# Patient Record
Sex: Male | Born: 1965 | Race: Black or African American | Hispanic: No | Smoking: Current every day smoker
Health system: Southern US, Community
[De-identification: ages and names within clinical notes are randomized; demographics above are authoritative.]

## PROBLEM LIST (undated history)

## (undated) DIAGNOSIS — Z720 Tobacco use: Secondary | ICD-10-CM

## (undated) DIAGNOSIS — E119 Type 2 diabetes mellitus without complications: Secondary | ICD-10-CM

## (undated) DIAGNOSIS — F1010 Alcohol abuse, uncomplicated: Secondary | ICD-10-CM

## (undated) HISTORY — PX: TONSILLECTOMY: SUR1361

---

## 2006-07-06 ENCOUNTER — Ambulatory Visit: Payer: Self-pay | Admitting: Family Medicine

## 2006-07-06 ENCOUNTER — Inpatient Hospital Stay (HOSPITAL_COMMUNITY): Admission: EM | Admit: 2006-07-06 | Discharge: 2006-07-08 | Payer: Self-pay | Admitting: Emergency Medicine

## 2012-04-21 ENCOUNTER — Other Ambulatory Visit: Payer: Self-pay | Admitting: Family Medicine

## 2016-10-31 DIAGNOSIS — E1140 Type 2 diabetes mellitus with diabetic neuropathy, unspecified: Secondary | ICD-10-CM | POA: Diagnosis not present

## 2016-10-31 DIAGNOSIS — I10 Essential (primary) hypertension: Secondary | ICD-10-CM | POA: Diagnosis not present

## 2016-10-31 DIAGNOSIS — E7800 Pure hypercholesterolemia, unspecified: Secondary | ICD-10-CM | POA: Diagnosis not present

## 2016-10-31 DIAGNOSIS — E119 Type 2 diabetes mellitus without complications: Secondary | ICD-10-CM | POA: Diagnosis not present

## 2016-11-17 DIAGNOSIS — Z1389 Encounter for screening for other disorder: Secondary | ICD-10-CM | POA: Diagnosis not present

## 2016-11-17 DIAGNOSIS — E7800 Pure hypercholesterolemia, unspecified: Secondary | ICD-10-CM | POA: Diagnosis not present

## 2016-11-17 DIAGNOSIS — I10 Essential (primary) hypertension: Secondary | ICD-10-CM | POA: Diagnosis not present

## 2016-11-17 DIAGNOSIS — E119 Type 2 diabetes mellitus without complications: Secondary | ICD-10-CM | POA: Diagnosis not present

## 2016-11-17 DIAGNOSIS — E1140 Type 2 diabetes mellitus with diabetic neuropathy, unspecified: Secondary | ICD-10-CM | POA: Diagnosis not present

## 2017-01-19 DIAGNOSIS — E663 Overweight: Secondary | ICD-10-CM | POA: Diagnosis not present

## 2017-01-19 DIAGNOSIS — I10 Essential (primary) hypertension: Secondary | ICD-10-CM | POA: Diagnosis not present

## 2017-01-19 DIAGNOSIS — E119 Type 2 diabetes mellitus without complications: Secondary | ICD-10-CM | POA: Diagnosis not present

## 2017-01-19 DIAGNOSIS — E7800 Pure hypercholesterolemia, unspecified: Secondary | ICD-10-CM | POA: Diagnosis not present

## 2017-01-19 DIAGNOSIS — E1140 Type 2 diabetes mellitus with diabetic neuropathy, unspecified: Secondary | ICD-10-CM | POA: Diagnosis not present

## 2017-03-02 DIAGNOSIS — Z7982 Long term (current) use of aspirin: Secondary | ICD-10-CM | POA: Diagnosis not present

## 2017-03-02 DIAGNOSIS — Z79899 Other long term (current) drug therapy: Secondary | ICD-10-CM | POA: Diagnosis not present

## 2017-03-02 DIAGNOSIS — F17210 Nicotine dependence, cigarettes, uncomplicated: Secondary | ICD-10-CM | POA: Diagnosis not present

## 2017-03-02 DIAGNOSIS — T63441A Toxic effect of venom of bees, accidental (unintentional), initial encounter: Secondary | ICD-10-CM | POA: Diagnosis not present

## 2017-03-02 DIAGNOSIS — R05 Cough: Secondary | ICD-10-CM | POA: Diagnosis not present

## 2017-03-02 DIAGNOSIS — L299 Pruritus, unspecified: Secondary | ICD-10-CM | POA: Diagnosis not present

## 2017-03-02 DIAGNOSIS — E119 Type 2 diabetes mellitus without complications: Secondary | ICD-10-CM | POA: Diagnosis not present

## 2017-03-02 DIAGNOSIS — R221 Localized swelling, mass and lump, neck: Secondary | ICD-10-CM | POA: Diagnosis not present

## 2017-03-02 DIAGNOSIS — Z7984 Long term (current) use of oral hypoglycemic drugs: Secondary | ICD-10-CM | POA: Diagnosis not present

## 2017-03-02 DIAGNOSIS — I10 Essential (primary) hypertension: Secondary | ICD-10-CM | POA: Diagnosis not present

## 2017-03-02 DIAGNOSIS — E7800 Pure hypercholesterolemia, unspecified: Secondary | ICD-10-CM | POA: Diagnosis not present

## 2017-04-20 DIAGNOSIS — I10 Essential (primary) hypertension: Secondary | ICD-10-CM | POA: Diagnosis not present

## 2017-04-20 DIAGNOSIS — E7800 Pure hypercholesterolemia, unspecified: Secondary | ICD-10-CM | POA: Diagnosis not present

## 2017-04-20 DIAGNOSIS — E119 Type 2 diabetes mellitus without complications: Secondary | ICD-10-CM | POA: Diagnosis not present

## 2017-04-20 DIAGNOSIS — Z23 Encounter for immunization: Secondary | ICD-10-CM | POA: Diagnosis not present

## 2017-04-20 DIAGNOSIS — E1140 Type 2 diabetes mellitus with diabetic neuropathy, unspecified: Secondary | ICD-10-CM | POA: Diagnosis not present

## 2017-05-22 ENCOUNTER — Other Ambulatory Visit: Payer: Self-pay | Admitting: Occupational Medicine

## 2017-05-22 ENCOUNTER — Ambulatory Visit: Payer: Self-pay

## 2017-05-22 DIAGNOSIS — M79644 Pain in right finger(s): Secondary | ICD-10-CM

## 2017-08-03 DIAGNOSIS — E119 Type 2 diabetes mellitus without complications: Secondary | ICD-10-CM | POA: Diagnosis not present

## 2017-08-03 DIAGNOSIS — E1140 Type 2 diabetes mellitus with diabetic neuropathy, unspecified: Secondary | ICD-10-CM | POA: Diagnosis not present

## 2017-08-03 DIAGNOSIS — E7800 Pure hypercholesterolemia, unspecified: Secondary | ICD-10-CM | POA: Diagnosis not present

## 2017-08-03 DIAGNOSIS — I10 Essential (primary) hypertension: Secondary | ICD-10-CM | POA: Diagnosis not present

## 2017-09-07 DIAGNOSIS — E119 Type 2 diabetes mellitus without complications: Secondary | ICD-10-CM | POA: Diagnosis not present

## 2017-09-07 DIAGNOSIS — E1140 Type 2 diabetes mellitus with diabetic neuropathy, unspecified: Secondary | ICD-10-CM | POA: Diagnosis not present

## 2017-09-07 DIAGNOSIS — E7800 Pure hypercholesterolemia, unspecified: Secondary | ICD-10-CM | POA: Diagnosis not present

## 2017-09-07 DIAGNOSIS — I10 Essential (primary) hypertension: Secondary | ICD-10-CM | POA: Diagnosis not present

## 2017-09-18 ENCOUNTER — Other Ambulatory Visit: Payer: Self-pay

## 2017-09-18 ENCOUNTER — Observation Stay (HOSPITAL_COMMUNITY)
Admission: EM | Admit: 2017-09-18 | Discharge: 2017-09-19 | Disposition: A | Discharge status: Home / Self Care | Payer: BLUE CROSS/BLUE SHIELD | Attending: Internal Medicine | Admitting: Internal Medicine

## 2017-09-18 ENCOUNTER — Encounter (HOSPITAL_COMMUNITY): Payer: Self-pay | Admitting: Emergency Medicine

## 2017-09-18 DIAGNOSIS — E860 Dehydration: Secondary | ICD-10-CM | POA: Insufficient documentation

## 2017-09-18 DIAGNOSIS — E119 Type 2 diabetes mellitus without complications: Secondary | ICD-10-CM | POA: Diagnosis not present

## 2017-09-18 DIAGNOSIS — Z720 Tobacco use: Secondary | ICD-10-CM | POA: Diagnosis not present

## 2017-09-18 DIAGNOSIS — F1010 Alcohol abuse, uncomplicated: Secondary | ICD-10-CM | POA: Insufficient documentation

## 2017-09-18 DIAGNOSIS — Z79899 Other long term (current) drug therapy: Secondary | ICD-10-CM | POA: Diagnosis not present

## 2017-09-18 DIAGNOSIS — E538 Deficiency of other specified B group vitamins: Secondary | ICD-10-CM | POA: Insufficient documentation

## 2017-09-18 DIAGNOSIS — D509 Iron deficiency anemia, unspecified: Secondary | ICD-10-CM | POA: Diagnosis not present

## 2017-09-18 DIAGNOSIS — Z7984 Long term (current) use of oral hypoglycemic drugs: Secondary | ICD-10-CM | POA: Diagnosis not present

## 2017-09-18 DIAGNOSIS — R031 Nonspecific low blood-pressure reading: Secondary | ICD-10-CM | POA: Diagnosis not present

## 2017-09-18 DIAGNOSIS — K922 Gastrointestinal hemorrhage, unspecified: Secondary | ICD-10-CM | POA: Diagnosis not present

## 2017-09-18 DIAGNOSIS — F17200 Nicotine dependence, unspecified, uncomplicated: Secondary | ICD-10-CM | POA: Diagnosis not present

## 2017-09-18 DIAGNOSIS — K625 Hemorrhage of anus and rectum: Secondary | ICD-10-CM

## 2017-09-18 DIAGNOSIS — N179 Acute kidney failure, unspecified: Secondary | ICD-10-CM | POA: Insufficient documentation

## 2017-09-18 DIAGNOSIS — Z7982 Long term (current) use of aspirin: Secondary | ICD-10-CM | POA: Diagnosis not present

## 2017-09-18 HISTORY — DX: Tobacco use: Z72.0

## 2017-09-18 HISTORY — DX: Alcohol abuse, uncomplicated: F10.10

## 2017-09-18 HISTORY — DX: Type 2 diabetes mellitus without complications: E11.9

## 2017-09-18 LAB — RAPID URINE DRUG SCREEN, HOSP PERFORMED
Amphetamines: NOT DETECTED
BARBITURATES: NOT DETECTED
BENZODIAZEPINES: POSITIVE — AB
COCAINE: NOT DETECTED
Opiates: NOT DETECTED
Tetrahydrocannabinol: NOT DETECTED

## 2017-09-18 LAB — COMPREHENSIVE METABOLIC PANEL
ALK PHOS: 47 U/L (ref 38–126)
ALT: 15 U/L — ABNORMAL LOW (ref 17–63)
AST: 18 U/L (ref 15–41)
Albumin: 3.4 g/dL — ABNORMAL LOW (ref 3.5–5.0)
Anion gap: 10 (ref 5–15)
BUN: 17 mg/dL (ref 6–20)
CO2: 24 mmol/L (ref 22–32)
CREATININE: 1.34 mg/dL — AB (ref 0.61–1.24)
Calcium: 8.7 mg/dL — ABNORMAL LOW (ref 8.9–10.3)
Chloride: 103 mmol/L (ref 101–111)
GFR calc Af Amer: >60 mL/min (ref >60)
GFR, EST NON AFRICAN AMERICAN: 59 mL/min — AB (ref >60)
Glucose, Bld: 157 mg/dL — ABNORMAL HIGH (ref 65–99)
Potassium: 4.5 mmol/L (ref 3.5–5.1)
Sodium: 137 mmol/L (ref 135–145)
Total Bilirubin: 0.9 mg/dL (ref 0.3–1.2)
Total Protein: 5.8 g/dL — ABNORMAL LOW (ref 6.5–8.1)

## 2017-09-18 LAB — BASIC METABOLIC PANEL
ANION GAP: 8 (ref 5–15)
BUN: 16 mg/dL (ref 6–20)
CHLORIDE: 106 mmol/L (ref 101–111)
CO2: 24 mmol/L (ref 22–32)
Calcium: 8.7 mg/dL — ABNORMAL LOW (ref 8.9–10.3)
Creatinine, Ser: 1.12 mg/dL (ref 0.61–1.24)
GFR calc Af Amer: >60 mL/min (ref >60)
GLUCOSE: 113 mg/dL — AB (ref 65–99)
POTASSIUM: 4.4 mmol/L (ref 3.5–5.1)
Sodium: 138 mmol/L (ref 135–145)

## 2017-09-18 LAB — CBC WITH DIFFERENTIAL/PLATELET
Basophils Absolute: 0.1 10*3/uL (ref 0.0–0.1)
Basophils Relative: 1 %
EOS ABS: 0.1 10*3/uL (ref 0.0–0.7)
EOS PCT: 2 %
HCT: 37 % — ABNORMAL LOW (ref 39.0–52.0)
Hemoglobin: 13.1 g/dL (ref 13.0–17.0)
LYMPHS ABS: 1.9 10*3/uL (ref 0.7–4.0)
Lymphocytes Relative: 23 %
MCH: 26.3 pg (ref 26.0–34.0)
MCHC: 35.4 g/dL (ref 30.0–36.0)
MCV: 74.3 fL — ABNORMAL LOW (ref 78.0–100.0)
Monocytes Absolute: 0.4 10*3/uL (ref 0.1–1.0)
Monocytes Relative: 5 %
Neutro Abs: 5.7 10*3/uL (ref 1.7–7.7)
Neutrophils Relative %: 69 %
PLATELETS: 150 10*3/uL (ref 150–400)
RBC: 4.98 MIL/uL (ref 4.22–5.81)
RDW: 13.4 % (ref 11.5–15.5)
WBC: 8.2 10*3/uL (ref 4.0–10.5)

## 2017-09-18 LAB — CBC
HCT: 35.5 % — ABNORMAL LOW (ref 39.0–52.0)
HCT: 33.1 % — ABNORMAL LOW (ref 39.0–52.0)
HCT: 30.1 % — ABNORMAL LOW (ref 39.0–52.0)
Hemoglobin: 12.7 g/dL — ABNORMAL LOW (ref 13.0–17.0)
Hemoglobin: 11.6 g/dL — ABNORMAL LOW (ref 13.0–17.0)
Hemoglobin: 10.4 g/dL — ABNORMAL LOW (ref 13.0–17.0)
MCH: 26.5 pg (ref 26.0–34.0)
MCH: 26 pg (ref 26.0–34.0)
MCH: 25.7 pg — AB (ref 26.0–34.0)
MCHC: 35.8 g/dL (ref 30.0–36.0)
MCHC: 35 g/dL (ref 30.0–36.0)
MCHC: 34.6 g/dL (ref 30.0–36.0)
MCV: 74.1 fL — ABNORMAL LOW (ref 78.0–100.0)
MCV: 74.2 fL — ABNORMAL LOW (ref 78.0–100.0)
MCV: 74.3 fL — ABNORMAL LOW (ref 78.0–100.0)
PLATELETS: 142 10*3/uL — AB (ref 150–400)
PLATELETS: 133 10*3/uL — AB (ref 150–400)
Platelets: 137 10*3/uL — ABNORMAL LOW (ref 150–400)
RBC: 4.79 MIL/uL (ref 4.22–5.81)
RBC: 4.46 MIL/uL (ref 4.22–5.81)
RBC: 4.05 MIL/uL — AB (ref 4.22–5.81)
RDW: 13.4 % (ref 11.5–15.5)
RDW: 13.7 % (ref 11.5–15.5)
RDW: 13.4 % (ref 11.5–15.5)
WBC: 9.9 10*3/uL (ref 4.0–10.5)
WBC: 17.2 10*3/uL — ABNORMAL HIGH (ref 4.0–10.5)
WBC: 15 10*3/uL — AB (ref 4.0–10.5)

## 2017-09-18 LAB — FERRITIN: Ferritin: 35 ng/mL (ref 24–336)

## 2017-09-18 LAB — GLUCOSE, CAPILLARY
GLUCOSE-CAPILLARY: 76 mg/dL (ref 65–99)
GLUCOSE-CAPILLARY: 137 mg/dL — AB (ref 65–99)
Glucose-Capillary: 99 mg/dL (ref 65–99)
Glucose-Capillary: 69 mg/dL (ref 65–99)

## 2017-09-18 LAB — HEMOGLOBIN A1C
Hgb A1c MFr Bld: 6.4 % — ABNORMAL HIGH (ref 4.8–5.6)
Mean Plasma Glucose: 136.98 mg/dL

## 2017-09-18 LAB — TYPE AND SCREEN
ABO/RH(D): A POS
ANTIBODY SCREEN: NEGATIVE

## 2017-09-18 LAB — PROTIME-INR
INR: 1.05
PROTHROMBIN TIME: 13.6 s (ref 11.4–15.2)

## 2017-09-18 LAB — APTT: aPTT: 26 seconds (ref 24–36)

## 2017-09-18 LAB — RETICULOCYTES
RBC.: 4.79 MIL/uL (ref 4.22–5.81)
RETIC CT PCT: 0.7 % (ref 0.4–3.1)
Retic Count, Absolute: 33.5 10*3/uL (ref 19.0–186.0)

## 2017-09-18 LAB — VITAMIN B12: Vitamin B-12: 156 pg/mL — ABNORMAL LOW (ref 180–914)

## 2017-09-18 LAB — ETHANOL: ALCOHOL ETHYL (B): 44 mg/dL — AB (ref <10)

## 2017-09-18 LAB — HIV ANTIBODY (ROUTINE TESTING W REFLEX): HIV SCREEN 4TH GENERATION: NONREACTIVE

## 2017-09-18 LAB — IRON AND TIBC
Iron: 102 ug/dL (ref 45–182)
SATURATION RATIOS: 35 % (ref 17.9–39.5)
TIBC: 295 ug/dL (ref 250–450)
UIBC: 193 ug/dL

## 2017-09-18 LAB — ABO/RH: ABO/RH(D): A POS

## 2017-09-18 LAB — AMMONIA: AMMONIA: 30 umol/L (ref 9–35)

## 2017-09-18 LAB — SODIUM, URINE, RANDOM: Sodium, Ur: 164 mmol/L

## 2017-09-18 LAB — CREATININE, URINE, RANDOM: Creatinine, Urine: 227.52 mg/dL

## 2017-09-18 LAB — FOLATE: Folate: 9.3 ng/mL (ref >5.9)

## 2017-09-18 MED ORDER — ADULT MULTIVITAMIN W/MINERALS CH
1.0000 | ORAL_TABLET | Freq: Every day | ORAL | Status: DC
  Administered 2017-09-19: 1 via ORAL
  Filled 2017-09-18 (×2): qty 1

## 2017-09-18 MED ORDER — FOLIC ACID 1 MG PO TABS
1.0000 mg | ORAL_TABLET | Freq: Every day | ORAL | Status: DC
  Administered 2017-09-18 – 2017-09-19 (×2): 1 mg via ORAL
  Filled 2017-09-18 (×2): qty 1

## 2017-09-18 MED ORDER — SODIUM CHLORIDE 0.9 % IV SOLN
8.0000 mg/h | INTRAVENOUS | Status: DC
  Administered 2017-09-18 – 2017-09-19 (×3): 8 mg/h via INTRAVENOUS
  Filled 2017-09-18 (×7): qty 80

## 2017-09-18 MED ORDER — SODIUM CHLORIDE 0.9 % IV BOLUS (SEPSIS)
1000.0000 mL | Freq: Once | INTRAVENOUS | Status: AC
  Administered 2017-09-18: 1000 mL via INTRAVENOUS

## 2017-09-18 MED ORDER — ACETAMINOPHEN 325 MG PO TABS: 650.0000 mg | ORAL_TABLET | Freq: Four times a day (QID) | ORAL | Status: DC | PRN

## 2017-09-18 MED ORDER — ACETAMINOPHEN 650 MG RE SUPP: 650.0000 mg | Freq: Four times a day (QID) | RECTAL | Status: DC | PRN

## 2017-09-18 MED ORDER — SODIUM CHLORIDE 0.9 % IV SOLN
INTRAVENOUS | Status: DC
  Administered 2017-09-18 – 2017-09-19 (×3): via INTRAVENOUS

## 2017-09-18 MED ORDER — SODIUM CHLORIDE 0.9 % IV SOLN
80.0000 mg | Freq: Once | INTRAVENOUS | Status: AC
  Administered 2017-09-18: 80 mg via INTRAVENOUS
  Filled 2017-09-18: qty 80

## 2017-09-18 MED ORDER — NICOTINE 21 MG/24HR TD PT24
21.0000 mg | MEDICATED_PATCH | Freq: Every day | TRANSDERMAL | Status: DC
  Administered 2017-09-18 – 2017-09-19 (×2): 21 mg via TRANSDERMAL
  Filled 2017-09-18 (×2): qty 1

## 2017-09-18 MED ORDER — ZOLPIDEM TARTRATE 5 MG PO TABS: 5.0000 mg | ORAL_TABLET | Freq: Every evening | ORAL | Status: DC | PRN

## 2017-09-18 MED ORDER — THIAMINE HCL 100 MG/ML IJ SOLN: 100.0000 mg | Freq: Every day | INTRAMUSCULAR | Status: DC

## 2017-09-18 MED ORDER — LORAZEPAM 1 MG PO TABS: 1.0000 mg | ORAL_TABLET | Freq: Four times a day (QID) | ORAL | Status: DC | PRN

## 2017-09-18 MED ORDER — LORAZEPAM 2 MG/ML IJ SOLN: 0.0000 mg | Freq: Four times a day (QID) | INTRAMUSCULAR | Status: DC

## 2017-09-18 MED ORDER — SODIUM CHLORIDE 0.9 % IV BOLUS (SEPSIS): 500.0000 mL | Freq: Once | INTRAVENOUS | Status: DC

## 2017-09-18 MED ORDER — VITAMIN B-1 100 MG PO TABS
100.0000 mg | ORAL_TABLET | Freq: Every day | ORAL | Status: DC
  Administered 2017-09-18 – 2017-09-19 (×2): 100 mg via ORAL
  Filled 2017-09-18 (×2): qty 1

## 2017-09-18 MED ORDER — ONDANSETRON HCL 4 MG PO TABS: 4.0000 mg | ORAL_TABLET | Freq: Four times a day (QID) | ORAL | Status: DC | PRN

## 2017-09-18 MED ORDER — PANTOPRAZOLE SODIUM 40 MG IV SOLR: 40.0000 mg | Freq: Two times a day (BID) | INTRAVENOUS | Status: DC

## 2017-09-18 MED ORDER — INSULIN ASPART 100 UNIT/ML ~~LOC~~ SOLN: 0.0000 [IU] | Freq: Every day | SUBCUTANEOUS | Status: DC

## 2017-09-18 MED ORDER — LORAZEPAM 2 MG/ML IJ SOLN: 0.0000 mg | Freq: Two times a day (BID) | INTRAMUSCULAR | Status: DC

## 2017-09-18 MED ORDER — LORAZEPAM 2 MG/ML IJ SOLN: 1.0000 mg | Freq: Four times a day (QID) | INTRAMUSCULAR | Status: DC | PRN

## 2017-09-18 MED ORDER — ONDANSETRON HCL 4 MG/2ML IJ SOLN: 4.0000 mg | Freq: Four times a day (QID) | INTRAMUSCULAR | Status: DC | PRN

## 2017-09-18 MED ORDER — INSULIN ASPART 100 UNIT/ML ~~LOC~~ SOLN: 0.0000 [IU] | Freq: Three times a day (TID) | SUBCUTANEOUS | Status: DC

## 2017-09-18 NOTE — Progress Notes (Signed)
Hypoglycemic Event  CBG: 69  Treatment: 15 GM carbohydrate snack  Symptoms: Hungry  Follow-up CBG: Time:2248 CBG Result:137  Possible Reasons for Event: Inadequate meal intake  Comments/MD notified:On-call NP, Joseph CottaKirby notified.    Joseph GermanyKaelin M Ceasar Anderson

## 2017-09-18 NOTE — H&P (Signed)
History and Physical    Joseph Anderson AVW:[id number] DOB: 1966-07-10 DOA: 09/18/2017  Referring MD/NP/PA:   PCP: Patient, No Pcp Per   Patient coming from:  The patient is coming from home.  At baseline, pt is independent for most of ADL.  Chief Complaint: rectal bleeding  HPI: Joseph Anderson is a 52 y.o. male with medical history significant of tobacco abuse, tobacco abuse, diabetes mellitus, who presents with rectal bleeding.  Pt states that he has had 3 episodes of rectal bleeding with bright red blood. The first episode happened around 8:30 PM. The first two episodes were with small amount of bleeding, but the third episode was with large amount of bleeding. Pt has nausea and vomited once, but no hematemesis. Patient denies abdominal pain. He states that had one episode of dizziness and nearly passed out, but did not. [facility], but did not wait to be seen. He went home, due to third episode of rectal bleeding, he called 911. Per EMS, Pt soiled chair with bright red blood. Pt's BP 80/48 upon EMS arrival. Pt's skin feels clammy. He denies chest pain, shortness of breath. No cough, fever or chills. Denies symptoms of UTI or unilateral weakness. Pt states that he had negative colonoscopy in [place] 3 years ago. Pt responded to IV fluid resuscitation, after 2 L normal saline bolus, his blood pressure improved to 101/63.  ED Course: pt was found to have hemoglobin 13.1, WBC 8.2, normal LFT, INR 1.05, acute renal injury with creatinine 1.34, temperature normal, heart rate in 90s, tachypnea, O2 sats 90-94% on room air. Patient is admitted to telemetry bed as inpatient.   Review of Systems:   General: no fevers, chills, no body weight gain, has poor appetite, has fatigue HEENT: no blurry vision, hearing changes or sore throat Respiratory: no dyspnea, coughing, wheezing CV: no chest pain, no palpitations GI: has nausea, vomiting and rectal bleeding, no abdominal pain, diarrhea,  constipation GU: no dysuria, burning on urination, increased urinary frequency, hematuria  Ext: no leg edema Neuro: no unilateral weakness, numbness, or tingling, no vision change or hearing loss. Has dizziness. Skin: no rash, no skin tear. MSK: No muscle spasm, no deformity, no limitation of range of movement in spin Heme: No easy bruising.  Travel history: No recent long distant travel.  Allergy: No Known Allergies  Past Medical History:  Diagnosis Date  . Alcohol abuse   . Diabetes mellitus without complication (HCC)   . Tobacco abuse     Past Surgical History:  Procedure Laterality Date  . TONSILLECTOMY      Social History:  reports that he has been smoking.  He has been smoking about 1.00 pack per day. he has never used smokeless tobacco. He reports that he drinks alcohol. He reports that he does not use drugs.  Family History:  Family History  Problem Relation Age of Onset  . Diabetes Mellitus II Father   . HIV Sister      Prior to Admission medications   Medication Sig Start Date End Date Taking? Authorizing Provider  metFORMIN (GLUCOPHAGE) 500 MG tablet Take 1,000 mg by mouth 2 (two) times daily with a meal.   Yes [provider]    Physical Exam: Vitals:   09/18/17 0347 09/18/17 0400 09/18/17 0415 09/18/17 0430  BP: 101/63 (!) 94/59 103/62 99/80  Pulse: 94 92 94 91  Resp: (!) 23 (!) 28 (!) 31 (!) 26  Temp:      TempSrc:  SpO2: 94% 91% 93% 93%  Weight:      Height:       General: Not in acute distress HEENT:       Eyes: PERRL, EOMI, no scleral icterus.       ENT: No discharge from the ears and nose, no pharynx injection, no tonsillar enlargement.        Neck: No JVD, no bruit, no mass felt. Heme: No neck lymph node enlargement. Cardiac: S1/S2, RRR, No murmurs, No gallops or rubs. Respiratory: no rales, wheezing, rhonchi or rubs. GI: Soft, nondistended, nontender, no rebound pain, no organomegaly, BS present. GU: No hematuria Ext: No  pitting leg edema bilaterally. 2+DP/PT pulse bilaterally. Musculoskeletal: No joint deformities, No joint redness or warmth, no limitation of ROM in spin. Skin: No rashes.  Neuro: Alert, oriented X3, cranial nerves II-XII grossly intact, moves all extremities normally.  Psych: Patient is not psychotic, no suicidal or hemocidal ideation.  Labs on Admission: I have personally reviewed following labs and imaging studies  CBC: Recent Labs  Lab 09/18/17 0248  WBC 8.2  NEUTROABS 5.7  HGB 13.1  HCT 37.0*  MCV 74.3*  PLT 150   Basic Metabolic Panel: Recent Labs  Lab 09/18/17 0248  NA 137  K 4.5  CL 103  CO2 24  GLUCOSE 157*  BUN 17  CREATININE 1.34*  CALCIUM 8.7*   GFR: Estimated Creatinine Clearance: 64.4 mL/min (A) (by C-G formula based on SCr of 1.34 mg/dL (H)). Liver Function Tests: Recent Labs  Lab 09/18/17 0248  AST 18  ALT 15*  ALKPHOS 47  BILITOT 0.9  PROT 5.8*  ALBUMIN 3.4*   No results for input(s): LIPASE, AMYLASE in the last 168 hours. No results for input(s): AMMONIA in the last 168 hours. Coagulation Profile: Recent Labs  Lab 09/18/17 0318  INR 1.05   Cardiac Enzymes: No results for input(s): CKTOTAL, CKMB, CKMBINDEX, TROPONINI in the last 168 hours. BNP (last 3 results) No results for input(s): PROBNP in the last 8760 hours. HbA1C: No results for input(s): HGBA1C in the last 72 hours. CBG: No results for input(s): GLUCAP in the last 168 hours. Lipid Profile: No results for input(s): CHOL, HDL, LDLCALC, TRIG, CHOLHDL, LDLDIRECT in the last 72 hours. Thyroid Function Tests: No results for input(s): TSH, T4TOTAL, FREET4, T3FREE, THYROIDAB in the last 72 hours. Anemia Panel: No results for input(s): VITAMINB12, FOLATE, FERRITIN, TIBC, IRON, RETICCTPCT in the last 72 hours. Urine analysis: No results found for: COLORURINE, APPEARANCEUR, LABSPEC, PHURINE, GLUCOSEU, HGBUR, BILIRUBINUR, KETONESUR, PROTEINUR, UROBILINOGEN, NITRITE,  LEUKOCYTESUR Sepsis Labs: @LABRCNTIP (procalcitonin:4,lacticidven:4) )No results found for this or any previous visit (from the past 240 hour(s)).   Radiological Exams on Admission: No results found.   EKG: Not done in ED, will get one.   Assessment/Plan Principal Problem:   Rectal bleeding Active Problems:   Diabetes mellitus without complication (HCC)   Tobacco abuse   Alcohol abuse   AKI (acute kidney injury) (HCC)   Rectal bleeding: Patient seems to have had lower GI bleeding. Patient was initially hypotensive, which responded to IV fluid resuscitation. After 2L of normal saline bolus, blood pressure is stabilized, currently Bp is 101/63.  - will admit to tele bed as inpt - NPO - IVF: 2L NS bolus, then at 125 mL/hr - Start IV pantoprazole gtt - Zofran IV for nausea - Avoid NSAIDs and SQ heparin - Maintain IV access (2 large bore IVs if possible). - Monitor closely and follow q6h cbc, transfuse as necessary, if  Hgb<7.0 - LaB: INR, PTT and type screen, anemia panel - please call GI in AM  Diabetes mellitus without complication: A1c is not on record. Blood sugar is 157. Pt is on metformin at home -SSI -check A1c  AKI: Likely due to prerenal secondary to dehydration. ATN is also possible given episode of hypotension - IVF as above - Check FeNa  - Follow up renal function by BMP  Tobacco abuse and Alcohol abuse: -Did counseling about importance of quitting smoking -Nicotine patch -Did counseling about the importance of quitting drinking -CIWA protocol    DVT ppx: SCD Code Status: Full code Family Communication: None at bed side.    Disposition Plan:  Anticipate discharge back to previous home environment Consults called:  none Admission status:   Inpatient/tele     Date of Service 09/18/2017    Lorretta Harp [facility] Pager [phone]  If 7PM-7AM, please contact night-coverage [email or web address] Password [facility] 09/18/2017, 4:52 AM

## 2017-09-18 NOTE — Care Management Note (Signed)
Case Management Note  Patient Details  Name: Joseph SchneidersSteven B Muhs MRN: [id number] Date of Birth: 1965-11-20  Subjective/Objective:                  52 y.o. male with medical history significant of tobacco abuse, tobacco abuse, diabetes mellitus, who presents with rectal bleeding. From home.  Action/Plan: Admit status INPATIENT (Rectal bleeding); anticipate discharge HOME WITH SELF CARE.   Expected Discharge Date:  (unknown)               Expected Discharge Plan:  Home/Self Care  In-House Referral:     Discharge planning Services  CM Consult  Post Acute Care Choice:    Choice offered to:     DME Arranged:    DME Agency:     HH Arranged:    HH Agency:     Status of Service:  In process, will continue to follow  If discussed at Long Length of Stay Meetings, dates discussed:    Additional Comments:  Oletta CohnWood, Lenola Lockner, RN 09/18/2017, 11:17 AM

## 2017-09-18 NOTE — ED Provider Notes (Signed)
[facility] EMERGENCY DEPARTMENT Provider Note   CSN: [id number] Arrival date & time: 09/18/17  4098     History   Chief Complaint Chief Complaint  Patient presents with  . GI Bleeding  Level 5 caveat due to acuity of condition  HPI Joseph Anderson is a 52 y.o. male.  The history is provided by the patient. The history is limited by the condition of the patient.  Rectal Bleeding  Quality:  Bright red Amount:  Copious Timing:  Intermittent Chronicity:  New Relieved by:  None tried Worsened by:  Nothing Associated symptoms: dizziness and vomiting   Associated symptoms: no abdominal pain, no fever and no hematemesis   Risk factors comment:  ASA use Patient with history of diabetes and alcohol abuse presents with rectal bleeding.  Apparently he had a large volume bloody bowel movement earlier in the night.  He also had vomiting.  He also reports feeling lightheaded and dizzy.  EMS was called, and he was noted to be hypotensive and to the 80s.  He responded with IV fluids.  Patient reports he takes 81 mg of aspirin every day.  He also admits to drinking "a few beers "each day to help him relax He reports having a colonoscopy before, but reports it was normal.  This was done in [place].    Past Medical History:  Diagnosis Date  . Diabetes mellitus without complication (HCC)      Home Medications    Prior to Admission medications   Not on File    Family History No family history on file.  Social History Social History   Tobacco Use  . Smoking status: Current Every Day Smoker    Packs/day: 1.00  . Smokeless tobacco: Never Used  Substance Use Topics  . Alcohol use: Yes    Comment: occassionally  . Drug use: No     Allergies   Patient has no known allergies.   Review of Systems Review of Systems  Unable to perform ROS: Acuity of condition  Constitutional: Negative for fever.  Gastrointestinal: Positive for hematochezia and vomiting.  Negative for abdominal pain and hematemesis.  Neurological: Positive for dizziness.     Physical Exam Updated Vital Signs BP 94/70   Pulse 89   Temp 98.3 F (36.8 C) (Oral)   Resp (!) 22   Ht 1.702 m (5\' 7" )   Wt 79.4 kg (175 lb)   SpO2 92%   BMI 27.41 kg/m   Physical Exam  CONSTITUTIONAL: Disheveled and anxious appearing, smells of alcohol HEAD: Normocephalic/atraumatic EYES: EOMI/PERRL ENMT: Mucous membranes dry NECK: supple no meningeal signs SPINE/BACK:entire spine nontender CV: S1/S2 noted, no murmurs/rubs/gallops noted LUNGS: Lungs are clear to auscultation bilaterally, no apparent distress ABDOMEN: soft, nontender, no rebound or guarding, bowel sounds noted throughout abdomen Rectal-bloody stool noted, no melena nurse present for exam GU:no cva tenderness NEURO: Pt is awake/alert/appropriate, moves all extremitiesx4.  No facial droop.   EXTREMITIES: pulses normal/equal, full ROM SKIN: warm, color normal PSYCH: Anxious  ED Treatments / Results  Labs (all labs ordered are listed, but only abnormal results are displayed) Labs Reviewed  COMPREHENSIVE METABOLIC PANEL - Abnormal; Notable for the following components:      Result Value   Glucose, Bld 157 (*)    Creatinine, Ser 1.34 (*)    Calcium 8.7 (*)    Total Protein 5.8 (*)    Albumin 3.4 (*)    ALT 15 (*)    GFR calc non Af  Amer 59 (*)    All other components within normal limits  CBC WITH DIFFERENTIAL/PLATELET - Abnormal; Notable for the following components:   HCT 37.0 (*)    MCV 74.3 (*)    All other components within normal limits  PROTIME-INR  ETHANOL  TYPE AND SCREEN  ABO/RH    EKG  EKG Interpretation None       Radiology No results found.  Procedures Procedures  CRITICAL CARE Performed by: Joya Gaskinsonald W Shelden Raborn Total critical care time: 33 minutes Critical care time was exclusive of separately billable procedures and treating other patients. Critical care was necessary to treat or  prevent imminent or life-threatening deterioration. Critical care was time spent personally by me on the following activities: development of treatment plan with patient and/or surrogate as well as nursing, discussions with consultants, evaluation of patient's response to treatment, examination of patient, obtaining history from patient or surrogate, ordering and performing treatments and interventions, ordering and review of laboratory studies, ordering and review of radiographic studies, pulse oximetry and re-evaluation of patient's condition. Patient with acute GI bleed, requiring IV fluids, patient had been hypotensive and is improving.  Medications Ordered in ED Medications  sodium chloride 0.9 % bolus 1,000 mL (0 mLs Intravenous Stopped 09/18/17 0358)     Initial Impression / Assessment and Plan / ED Course  I have reviewed the triage vital signs and the nursing notes.  Pertinent labs results that were available during my care of the patient were reviewed by me and considered in my medical decision making (see chart for details).     3:08 AM Patient with history of alcohol abuse presents with GI bleed.  He is hypotensive, but this is improving IV fluids.  Labs are pending at this time.  He has no focal abdominal pain.  He denies hematemesis.  Will follow closely 4:12 AM Presents with acute GI bleed, and had initially been hypotensive.  He is responding to IV fluids.  His blood pressure is now above 100.  He denies any abdominal pain.  He does take a daily aspirin, but his INR is normal and no other anticoagulants Discussed with Dr. Clyde LundborgNiu for admission Final Clinical Impressions(s) / ED Diagnoses   Final diagnoses:  Acute GI bleeding    ED Discharge Orders    None       Zadie RhineWickline, Delilah Mulgrew, MD 09/18/17 [phone]

## 2017-09-18 NOTE — Consult Note (Signed)
Referring Provider: Dr. Isidoro Donningai Primary Care Physician:  Patient, No Pcp Per Primary Gastroenterologist:  Gentry FitzUnassigned  Reason for Consultation:  GI bleed  HPI: Joseph Anderson is a 52 y.o. male being seen for a consultation due to rectal bleeding stating that he had the acute onset of bright red blood per rectum X 3 last night and thinks he passed out after the last episode of bleeding. Denies seeing any stool with the bleeding episodes. Denies abdominal pain or rectal pain. Vomited last night clear emesis. Reports [place] ago [place] (records not available at this time and he does not recall who did it). Hgb 12.7. No bleeding since presentation to the hospital. Feels ok. Hungry.  Past Medical History:  Diagnosis Date  . Alcohol abuse   . Diabetes mellitus without complication (HCC)   . Tobacco abuse     Past Surgical History:  Procedure Laterality Date  . TONSILLECTOMY      Prior to Admission medications   Medication Sig Start Date End Date Taking? Authorizing Provider  aspirin EC 81 MG tablet Take 81 mg by mouth daily.   Yes [provider]  calcium carbonate (TUMS - DOSED IN MG ELEMENTAL CALCIUM) 500 MG chewable tablet Chew 2 tablets by mouth as needed for indigestion or heartburn.   Yes [provider]  ibuprofen (MOTRIN IB) 200 MG tablet Take 200 mg by mouth daily.   Yes [provider]  lisinopril (PRINIVIL,ZESTRIL) 20 MG tablet Take 20 mg by mouth daily.   Yes [provider]  metFORMIN (GLUCOPHAGE) 500 MG tablet Take 1,000 mg by mouth 2 (two) times daily with a meal.   Yes [provider]  nicotine (NICODERM CQ - DOSED IN MG/24 HOURS) 21 mg/24hr patch Place 21 mg onto the skin daily.   Yes [provider]    Scheduled Meds: . folic acid  1 mg Oral Daily  . insulin aspart  0-5 Units Subcutaneous QHS  . insulin aspart  0-9 Units Subcutaneous TID WC  . LORazepam  0-4 mg Intravenous Q6H   Followed by  .  [START ON 09/20/2017] LORazepam  0-4 mg Intravenous Q12H  . multivitamin with minerals  1 tablet Oral Daily  . nicotine  21 mg Transdermal Daily  . [START ON 09/21/2017] pantoprazole  40 mg Intravenous Q12H  . thiamine  100 mg Oral Daily   Or  . thiamine  100 mg Intravenous Daily   Continuous Infusions: . sodium chloride    . pantoprozole (PROTONIX) infusion 8 mg/hr (09/18/17 0600)  . sodium chloride     PRN Meds:.acetaminophen **OR** acetaminophen, LORazepam **OR** LORazepam, ondansetron **OR** ondansetron (ZOFRAN) IV, zolpidem  Allergies as of 09/18/2017  . (No Known Allergies)    Family History  Problem Relation Age of Onset  . Diabetes Mellitus II Father   . HIV Sister     Social History   Socioeconomic History  . Marital status: Legally Separated    Spouse name: Not on file  . Number of children: Not on file  . Years of education: Not on file  . Highest education level: Not on file  Social Needs  . Financial resource strain: Not on file  . Food insecurity - worry: Not on file  . Food insecurity - inability: Not on file  . Transportation needs - medical: Not on file  . Transportation needs - non-medical: Not on file  Occupational History  . Not on file  Tobacco Use  .  Smoking status: Current Every Day Smoker    Packs/day: 1.00  . Smokeless tobacco: Never Used  Substance and Sexual Activity  . Alcohol use: Yes    Comment: occassionally  . Drug use: No  . Sexual activity: Not on file  Other Topics Concern  . Not on file  Social History Narrative  . Not on file    Review of Systems: All negative except as stated above in HPI.  Physical Exam: Vital signs: Vitals:   09/18/17 0645 09/18/17 1200  BP: 112/81 112/81  Pulse: (!) 101 (!) 101  Resp: (!) 29   Temp:    SpO2: 93%   T 98.3   General:  Lethargic, Well-developed, well-nourished, pleasant and cooperative in NAD Head: normocephalic, atraumatic Eyes: anicteric sclera ENT: oropharynx clear Neck:  supple, nontender Lungs:  Clear throughout to auscultation.   No wheezes, crackles, or rhonchi. No acute distress. Heart:  Regular rate and rhythm; no murmurs, clicks, rubs,  or gallops. Abdomen: soft, nontender, nondistended, +BS  Rectal:  Deferred Ext: no edema  GI:  Lab Results: Recent Labs    09/18/17 0248 09/18/17 0512  WBC 8.2 9.9  HGB 13.1 12.7*  HCT 37.0* 35.5*  PLT 150 142*   BMET Recent Labs    09/18/17 0248 09/18/17 0512  NA 137 138  K 4.5 4.4  CL 103 106  CO2 24 24  GLUCOSE 157* 113*  BUN 17 16  CREATININE 1.34* 1.12  CALCIUM 8.7* 8.7*   LFT Recent Labs    09/18/17 0248  PROT 5.8*  ALBUMIN 3.4*  AST 18  ALT 15*  ALKPHOS 47  BILITOT 0.9   PT/INR Recent Labs    09/18/17 0318  LABPROT 13.6  INR 1.05     Studies/Results: No results found.  Impression/Plan: Painless hematochezia likely due to diverticular bleeding. Hemodynamically stable. Hgb 12.7. Start clear liquid diet. Follow H/H. If bleeding recurs, then may need an updated colonoscopy during this hospitalization otherwise would manage conservatively and follow for any recurrent bleeding. If no further bleeding then advance diet tomorrow and f/u as an outpt to decide whether an updated colonoscopy is needed. Will follow.    LOS: 0 days   Siara Gorder C.  09/18/2017, 12:20 PM  Pager [phone]  AFTER 5 pm or on weekends please call [phone]

## 2017-09-18 NOTE — Progress Notes (Signed)
Patient seen and examined, admitted by Dr. Clyde LundborgNiu this morning for rectal bleeding.  BP 112/81   Pulse (!) 101   Temp 98.3 F (36.8 C) (Oral)   Resp (!) 29   Ht 5\' 7"  (1.702 m)   Wt 79.4 kg (175 lb)   SpO2 93%   BMI 27.41 kg/m    A/p Rectal bleeding - No active bleeding, continue IV fluid hydration, IV Protonix drip, BP stabilized, -Avoid NSAIDs. - GI consulted, called [facility] office - NPO , serial H&H  Acute kidney injury - Creatinine 1.3 at the time of admission, improved to 1.1 - Likely prerenal due to dehydration, rectal bleeding, hypotension - Continue gentle hydration  Vitamin B12 deficiency - CBC profile shows microcytic anemia rather than macrocytosis - B12 level borderline low 156 (normal 180-914) -  Place on daily oral B12 deficiency   Forrest Demuro M.D. [facility] 09/18/2017, 12:19 PM  Pager: [phone]

## 2017-09-18 NOTE — Progress Notes (Signed)
Joseph Anderson is a 52 y.o. male patient admitted from ED awake, alert - oriented  X 4 - no acute distress noted.  VSS - Blood pressure 117/69, pulse 95, temperature 99.1 F (37.3 C), temperature source Oral, resp. rate (!) 21, height 5\' 7"  (1.702 m), weight 78.9 kg (173 lb 15.1 oz), SpO2 96 %.    IV in place, occlusive dsg intact without redness.  Orientation to room, and floor completed with information packet given to patient/family.  Patient declined safety video at this time.  Admission INP armband ID verified with patient/family, and in place.   SR up x 2, fall assessment complete, with patient and family able to verbalize understanding of risk associated with falls, and verbalized understanding to call nsg before up out of bed.  Call light within reach, patient able to voice, and demonstrate understanding.  Skin, clean-dry- intact without evidence of bruising, or skin tears.   No evidence of skin break down noted on exam.     Will cont to eval and treat per MD orders.  Eligah EastErin M Tomaz Janis, RN 09/18/2017 5:34 PM

## 2017-09-18 NOTE — ED Triage Notes (Addendum)
Per [facility], Pt from home. Pt had small amount of rectal bleeding around 8:30 pm today, [facility] Per EMS, Pt soiled chair with bright red blood. Pt had 1 episode of vomiting, reports feeling near syncope. Pt's BP 80/48 upon EMS arrival. Pt's skin feels clammy, Pt alert oriented x4. Pt received 250 cc NS. Pt denies abdominal pain.

## 2017-09-19 DIAGNOSIS — K625 Hemorrhage of anus and rectum: Secondary | ICD-10-CM | POA: Diagnosis not present

## 2017-09-19 DIAGNOSIS — K922 Gastrointestinal hemorrhage, unspecified: Secondary | ICD-10-CM | POA: Diagnosis present

## 2017-09-19 LAB — CBC
HCT: 30.5 % — ABNORMAL LOW (ref 39.0–52.0)
HEMATOCRIT: 29.4 % — AB (ref 39.0–52.0)
HEMOGLOBIN: 10.4 g/dL — AB (ref 13.0–17.0)
HEMOGLOBIN: 10.5 g/dL — AB (ref 13.0–17.0)
MCH: 25.4 pg — ABNORMAL LOW (ref 26.0–34.0)
MCH: 26.7 pg (ref 26.0–34.0)
MCHC: 34.1 g/dL (ref 30.0–36.0)
MCHC: 35.7 g/dL (ref 30.0–36.0)
MCV: 74.6 fL — ABNORMAL LOW (ref 78.0–100.0)
MCV: 74.8 fL — AB (ref 78.0–100.0)
PLATELETS: 128 10*3/uL — AB (ref 150–400)
Platelets: 124 10*3/uL — ABNORMAL LOW (ref 150–400)
RBC: 4.09 MIL/uL — ABNORMAL LOW (ref 4.22–5.81)
RBC: 3.93 MIL/uL — AB (ref 4.22–5.81)
RDW: 13.2 % (ref 11.5–15.5)
RDW: 13.8 % (ref 11.5–15.5)
WBC: 12.1 10*3/uL — ABNORMAL HIGH (ref 4.0–10.5)
WBC: 10.8 10*3/uL — ABNORMAL HIGH (ref 4.0–10.5)

## 2017-09-19 LAB — BASIC METABOLIC PANEL
Anion gap: 8 (ref 5–15)
BUN: 11 mg/dL (ref 6–20)
CHLORIDE: 107 mmol/L (ref 101–111)
CO2: 24 mmol/L (ref 22–32)
CREATININE: 1.13 mg/dL (ref 0.61–1.24)
Calcium: 8.2 mg/dL — ABNORMAL LOW (ref 8.9–10.3)
GFR calc non Af Amer: >60 mL/min (ref >60)
Glucose, Bld: 90 mg/dL (ref 65–99)
POTASSIUM: 4.2 mmol/L (ref 3.5–5.1)
Sodium: 139 mmol/L (ref 135–145)

## 2017-09-19 LAB — GLUCOSE, CAPILLARY
Glucose-Capillary: 86 mg/dL (ref 65–99)
Glucose-Capillary: 118 mg/dL — ABNORMAL HIGH (ref 65–99)

## 2017-09-19 MED ORDER — PANTOPRAZOLE SODIUM 40 MG PO TBEC: 40.0000 mg | DELAYED_RELEASE_TABLET | Freq: Every day | ORAL | 0 refills | Status: AC

## 2017-09-19 MED ORDER — CYANOCOBALAMIN 500 MCG PO TABS: 500.0000 ug | ORAL_TABLET | Freq: Every day | ORAL | 1 refills | Status: AC

## 2017-09-19 NOTE — Discharge Summary (Signed)
Physician Discharge Summary  Joseph Anderson QMV:[id number] DOB: 07-16-1966 DOA: 09/18/2017  PCP: Patient, No Pcp Per  Admit date: 09/18/2017  Discharge date: 09/19/2017  Admitted From:Home  Disposition:  Home  Recommendations for Outpatient Follow-up:  1. Follow up with GI Dr. Bosie ClosSchooler in 2 weeks to consider outpatient colonoscopy  Home Health:N/A  Equipment/Devices:N/A  Discharge Condition:Stable  CODE STATUS: Full  Diet recommendation: Heart Healthy  Brief/Interim Summary:  Joseph Anderson is a 52 y.o. male with medical history significant of tobacco abuse, tobacco abuse, diabetes mellitus, who presents with rectal bleeding. He was initially made nothing by mouth and placed on IV fluid hydration as well as IV Protonix drip. GI was consult that to see the patient and Dr. Bosie ClosSchooler had advance the diet to clear liquid on the same day. Patient's hemoglobin and hematocrit have remained stable over the course of 2 days and remains at 10.4. He denies any further bowel movement with bleeding noted at this time and has otherwise remained stable overnight. His diet has been advanced to carb modified this morning which she has tolerated quite well. His anemia has stabilized and he is noted to have some B12 deficiency for which he'll be discharged on some oral B12. He will follow up with GI in 2 weeks for consideration of outpatient elective colonoscopy as needed.   Discharge Diagnoses:  Principal Problem:   Rectal bleeding Active Problems:   Diabetes mellitus without complication (HCC)   Tobacco abuse   Alcohol abuse   AKI (acute kidney injury) [facility](HCC)    Discharge Instructions  Discharge Instructions    Diet - low sodium heart healthy   Complete by:  As directed    Increase activity slowly   Complete by:  As directed      Allergies as of 09/19/2017   No Known Allergies     Medication List    STOP taking these medications   MOTRIN IB 200 MG tablet Generic drug:  ibuprofen      TAKE these medications   aspirin EC 81 MG tablet Take 81 mg by mouth daily.   calcium carbonate 500 MG chewable tablet Commonly known as:  TUMS - dosed in mg elemental calcium Chew 2 tablets by mouth as needed for indigestion or heartburn.   cyanocobalamin 500 MCG tablet Take 1 tablet (500 mcg total) by mouth daily.   lisinopril 20 MG tablet Commonly known as:  PRINIVIL,ZESTRIL Take 20 mg by mouth daily.   metFORMIN 500 MG tablet Commonly known as:  GLUCOPHAGE Take 1,000 mg by mouth 2 (two) times daily with a meal.   nicotine 21 mg/24hr patch Commonly known as:  NICODERM CQ - dosed in mg/24 hours Place 21 mg onto the skin daily.   pantoprazole 40 MG tablet Commonly known as:  PROTONIX Take 1 tablet (40 mg total) by mouth daily.      Follow-up Information    Charlott RakesSchooler, Vincent, MD Follow up in 2 week(s).   Specialty:  Gastroenterology Contact information: [address] [place] [address] [phone]          No Known Allergies  Consultations:  None   Procedures/Studies: No results found.   Subjective:   Discharge Exam: Vitals:   09/19/17 0247 09/19/17 0802  BP: 117/82 104/66  Pulse: 61 91  Resp: 17 19  Temp: 98.2 F (36.8 C) 98 F (36.7 C)  SpO2: 99% 96%   Vitals:   09/18/17 1719 09/18/17 2134 09/19/17 0247 09/19/17 0802  BP: 117/69 120/73 117/82 104/66  Pulse: 95 92 61 91  Resp:  20 17 19   Temp: 99.1 F (37.3 C) 98.2 F (36.8 C) 98.2 F (36.8 C) 98 F (36.7 C)  TempSrc: Oral Oral Oral Oral  SpO2: 96% 95% 99% 96%  Weight: 78.9 kg (173 lb 15.1 oz)     Height: 5\' 7"  (1.702 m)       General: Pt is alert, awake, not in acute distress Cardiovascular: RRR, S1/S2 +, no rubs, no gallops Respiratory: CTA bilaterally, no wheezing, no rhonchi Abdominal: Soft, NT, ND, bowel sounds + Extremities: no edema, no cyanosis    The results of significant diagnostics from this hospitalization (including imaging, microbiology,  ancillary and laboratory) are listed below for reference.     Microbiology: No results found for this or any previous visit (from the past 240 hour(s)).   Labs: BNP (last 3 results) No results for input(s): BNP in the last 8760 hours. Basic Metabolic Panel: Recent Labs  Lab 09/18/17 0248 09/18/17 0512 09/19/17 0501  NA 137 138 139  K 4.5 4.4 4.2  CL 103 106 107  CO2 24 24 24   GLUCOSE 157* 113* 90  BUN 17 16 11   CREATININE 1.34* 1.12 1.13  CALCIUM 8.7* 8.7* 8.2*   Liver Function Tests: Recent Labs  Lab 09/18/17 0248  AST 18  ALT 15*  ALKPHOS 47  BILITOT 0.9  PROT 5.8*  ALBUMIN 3.4*   No results for input(s): LIPASE, AMYLASE in the last 168 hours. Recent Labs  Lab 09/18/17 0512  AMMONIA 30   CBC: Recent Labs  Lab 09/18/17 0248 09/18/17 0512 09/18/17 1720 09/18/17 2124 09/19/17 0501  WBC 8.2 9.9 17.2* 15.0* 12.1*  NEUTROABS 5.7  --   --   --   --   HGB 13.1 12.7* 11.6* 10.4* 10.4*  HCT 37.0* 35.5* 33.1* 30.1* 30.5*  MCV 74.3* 74.1* 74.2* 74.3* 74.6*  PLT 150 142* 137* 133* 124*   Cardiac Enzymes: No results for input(s): CKTOTAL, CKMB, CKMBINDEX, TROPONINI in the last 168 hours. BNP: Invalid input(s): POCBNP CBG: Recent Labs  Lab 09/18/17 1724 09/18/17 2011 09/18/17 2151 09/18/17 2248 09/19/17 0801  GLUCAP 76 99 69 137* 86   D-Dimer No results for input(s): DDIMER in the last 72 hours. Hgb A1c Recent Labs    09/18/17 0512  HGBA1C 6.4*   Lipid Profile No results for input(s): CHOL, HDL, LDLCALC, TRIG, CHOLHDL, LDLDIRECT in the last 72 hours. Thyroid function studies No results for input(s): TSH, T4TOTAL, T3FREE, THYROIDAB in the last 72 hours.  Invalid input(s): FREET3 Anemia work up Recent Labs    09/18/17 0512  VITAMINB12 156*  FOLATE 9.3  FERRITIN 35  TIBC 295  IRON 102  RETICCTPCT 0.7   Urinalysis No results found for: COLORURINE, APPEARANCEUR, LABSPEC, PHURINE, GLUCOSEU, HGBUR, BILIRUBINUR, KETONESUR, PROTEINUR,  UROBILINOGEN, NITRITE, LEUKOCYTESUR Sepsis Labs Invalid input(s): PROCALCITONIN,  WBC,  LACTICIDVEN Microbiology No results found for this or any previous visit (from the past 240 hour(s)).   Time coordinating discharge: Over 30 minutes  SIGNED:   Erick Blinks, DO [facility] 09/19/2017, 11:12 AM Pager [phone]  If 7PM-7AM, please contact night-coverage [email or web address] Password [email or web address]

## 2017-09-19 NOTE — Progress Notes (Signed)
Apolonio SchneidersSteven B Salsgiver to be D/C'd Home per MD order.  Discussed with the patient and all questions fully answered.  VSS, Skin clean, dry and intact without evidence of skin break down, no evidence of skin tears noted. IV catheter discontinued intact. Site without signs and symptoms of complications. Dressing and pressure applied.  An After Visit Summary was printed and given to the patient. Patient received prescription.  D/c education completed with patient/family including follow up instructions, medication list, d/c activities limitations if indicated, with other d/c instructions as indicated by MD - patient able to verbalize understanding, all questions fully answered.   Patient instructed to return to ED, call 911, or call MD for any changes in condition.   Patient escorted via WC, and D/C home via private auto.  Casper HarrisonSamantha K Jacquelynne Guedes 09/19/2017 2:42 PM

## 2017-09-19 NOTE — Progress Notes (Signed)
[facility] Progress Note  Joseph SchneidersSteven B Anderson 52 y.o. [birth year]   Subjective: Feels good. No bleeding overnight. Denies abdominal pain.  Objective: Vital signs: Vitals:   09/19/17 0802 09/19/17 1204  BP: 104/66 116/77  Pulse: 91 85  Resp: 19 18  Temp: 98 F (36.7 C) 98.4 F (36.9 C)  SpO2: 96% 98%    Physical Exam: Gen: alert, no acute distress  HEENT: anicteric sclera CV: RRR Chest: CTA B Abd: soft, nontender, nondistended, +BS Ext: no edema  Lab Results: Recent Labs    09/18/17 0512 09/19/17 0501  NA 138 139  K 4.4 4.2  CL 106 107  CO2 24 24  GLUCOSE 113* 90  BUN 16 11  CREATININE 1.12 1.13  CALCIUM 8.7* 8.2*   Recent Labs    09/18/17 0248  AST 18  ALT 15*  ALKPHOS 47  BILITOT 0.9  PROT 5.8*  ALBUMIN 3.4*   Recent Labs    09/18/17 0248  09/19/17 0501 09/19/17 1143  WBC 8.2   < > 12.1* 10.8*  NEUTROABS 5.7  --   --   --   HGB 13.1   < > 10.4* 10.5*  HCT 37.0*   < > 30.5* 29.4*  MCV 74.3*   < > 74.6* 74.8*  PLT 150   < > 124* 128*   < > = values in this interval not displayed.      Assessment/Plan: Lower GI bleed likely diverticular that has resolved. F/U with me later this month and likely will need an updated colonoscopy as an outpt. Ok to go home today from GI standpoint. Will sign off. Call if questions.   Evora Schechter C. 09/19/2017, 1:44 PM  Pager [phone]  AFTER 5 PM or on weekends please call [phone] ID: Joseph SchneidersSteven B Anderson, male   DOB: [birth year], 52 y.o.   MRN: [id number]

## 2017-09-19 NOTE — Care Management Note (Signed)
Case Management Note  Patient Details  Name: Apolonio SchneidersSteven B Begin MRN: [id number] Date of Birth: 08-01-1965  Subjective/Objective:   CAP                [facility] , Dr.Lee  Action/Plan: Transition to home.  Expected Discharge Date:  09/19/17               Expected Discharge Plan:  Home/Self Care  In-House Referral:     Discharge planning Services  CM Consult  Post Acute Care Choice:   N/A Choice offered to:   N/A  DME Arranged:   N/A DME Agency:   N/A  HH Arranged:   N/A HH Agency:   N/A  Status of Service:  Completed, signed off  If discussed at Long Length of Stay Meetings, dates discussed:    Additional Comments:  Epifanio LeschesCole, Roselyn Doby Hudson, RN 09/19/2017, 12:45 PM

## 2017-09-26 DIAGNOSIS — R5382 Chronic fatigue, unspecified: Secondary | ICD-10-CM | POA: Diagnosis not present

## 2017-09-26 DIAGNOSIS — E119 Type 2 diabetes mellitus without complications: Secondary | ICD-10-CM | POA: Diagnosis not present

## 2017-09-26 DIAGNOSIS — E1140 Type 2 diabetes mellitus with diabetic neuropathy, unspecified: Secondary | ICD-10-CM | POA: Diagnosis not present

## 2017-09-26 DIAGNOSIS — K922 Gastrointestinal hemorrhage, unspecified: Secondary | ICD-10-CM | POA: Diagnosis not present

## 2017-10-01 DIAGNOSIS — I10 Essential (primary) hypertension: Secondary | ICD-10-CM | POA: Diagnosis not present

## 2017-10-04 DIAGNOSIS — I10 Essential (primary) hypertension: Secondary | ICD-10-CM | POA: Diagnosis not present

## 2017-10-10 DIAGNOSIS — K5791 Diverticulosis of intestine, part unspecified, without perforation or abscess with bleeding: Secondary | ICD-10-CM | POA: Diagnosis not present

## 2017-10-12 DIAGNOSIS — E119 Type 2 diabetes mellitus without complications: Secondary | ICD-10-CM | POA: Diagnosis not present

## 2017-10-12 DIAGNOSIS — E1140 Type 2 diabetes mellitus with diabetic neuropathy, unspecified: Secondary | ICD-10-CM | POA: Diagnosis not present

## 2017-10-12 DIAGNOSIS — I10 Essential (primary) hypertension: Secondary | ICD-10-CM | POA: Diagnosis not present

## 2017-10-12 DIAGNOSIS — K922 Gastrointestinal hemorrhage, unspecified: Secondary | ICD-10-CM | POA: Diagnosis not present

## 2017-10-31 DIAGNOSIS — K5791 Diverticulosis of intestine, part unspecified, without perforation or abscess with bleeding: Secondary | ICD-10-CM | POA: Diagnosis not present

## 2017-11-08 DIAGNOSIS — F332 Major depressive disorder, recurrent severe without psychotic features: Secondary | ICD-10-CM | POA: Diagnosis not present

## 2017-11-08 DIAGNOSIS — E1140 Type 2 diabetes mellitus with diabetic neuropathy, unspecified: Secondary | ICD-10-CM | POA: Diagnosis not present

## 2017-11-08 DIAGNOSIS — E119 Type 2 diabetes mellitus without complications: Secondary | ICD-10-CM | POA: Diagnosis not present

## 2017-11-08 DIAGNOSIS — K922 Gastrointestinal hemorrhage, unspecified: Secondary | ICD-10-CM | POA: Diagnosis not present

## 2017-11-23 DIAGNOSIS — E7800 Pure hypercholesterolemia, unspecified: Secondary | ICD-10-CM | POA: Diagnosis not present

## 2017-11-23 DIAGNOSIS — E663 Overweight: Secondary | ICD-10-CM | POA: Diagnosis not present

## 2017-11-23 DIAGNOSIS — E119 Type 2 diabetes mellitus without complications: Secondary | ICD-10-CM | POA: Diagnosis not present

## 2017-11-23 DIAGNOSIS — E1140 Type 2 diabetes mellitus with diabetic neuropathy, unspecified: Secondary | ICD-10-CM | POA: Diagnosis not present

## 2017-11-23 DIAGNOSIS — F332 Major depressive disorder, recurrent severe without psychotic features: Secondary | ICD-10-CM | POA: Diagnosis not present

## 2017-11-23 DIAGNOSIS — I10 Essential (primary) hypertension: Secondary | ICD-10-CM | POA: Diagnosis not present

## 2017-11-23 DIAGNOSIS — Z1331 Encounter for screening for depression: Secondary | ICD-10-CM | POA: Diagnosis not present

## 2017-11-23 DIAGNOSIS — K922 Gastrointestinal hemorrhage, unspecified: Secondary | ICD-10-CM | POA: Diagnosis not present

## 2017-12-28 DIAGNOSIS — E119 Type 2 diabetes mellitus without complications: Secondary | ICD-10-CM | POA: Diagnosis not present

## 2017-12-28 DIAGNOSIS — E291 Testicular hypofunction: Secondary | ICD-10-CM | POA: Diagnosis not present

## 2017-12-28 DIAGNOSIS — F332 Major depressive disorder, recurrent severe without psychotic features: Secondary | ICD-10-CM | POA: Diagnosis not present

## 2017-12-28 DIAGNOSIS — E1140 Type 2 diabetes mellitus with diabetic neuropathy, unspecified: Secondary | ICD-10-CM | POA: Diagnosis not present

## 2017-12-28 DIAGNOSIS — E663 Overweight: Secondary | ICD-10-CM | POA: Diagnosis not present

## 2018-03-22 DIAGNOSIS — E1140 Type 2 diabetes mellitus with diabetic neuropathy, unspecified: Secondary | ICD-10-CM | POA: Diagnosis not present

## 2018-03-22 DIAGNOSIS — F332 Major depressive disorder, recurrent severe without psychotic features: Secondary | ICD-10-CM | POA: Diagnosis not present

## 2018-03-22 DIAGNOSIS — E7800 Pure hypercholesterolemia, unspecified: Secondary | ICD-10-CM | POA: Diagnosis not present

## 2018-03-22 DIAGNOSIS — I10 Essential (primary) hypertension: Secondary | ICD-10-CM | POA: Diagnosis not present

## 2018-03-22 DIAGNOSIS — F5101 Primary insomnia: Secondary | ICD-10-CM | POA: Diagnosis not present

## 2018-03-22 DIAGNOSIS — E119 Type 2 diabetes mellitus without complications: Secondary | ICD-10-CM | POA: Diagnosis not present

## 2018-03-22 DIAGNOSIS — E663 Overweight: Secondary | ICD-10-CM | POA: Diagnosis not present

## 2018-04-05 DIAGNOSIS — E119 Type 2 diabetes mellitus without complications: Secondary | ICD-10-CM | POA: Diagnosis not present

## 2018-04-05 DIAGNOSIS — E1140 Type 2 diabetes mellitus with diabetic neuropathy, unspecified: Secondary | ICD-10-CM | POA: Diagnosis not present

## 2018-04-05 DIAGNOSIS — F5101 Primary insomnia: Secondary | ICD-10-CM | POA: Diagnosis not present

## 2018-04-05 DIAGNOSIS — I10 Essential (primary) hypertension: Secondary | ICD-10-CM | POA: Diagnosis not present

## 2018-04-05 DIAGNOSIS — Z23 Encounter for immunization: Secondary | ICD-10-CM | POA: Diagnosis not present

## 2018-04-19 IMAGING — DX DG FINGER LITTLE 2+V*R*
3 series · 3 of 3 positions shown · non-contrast
Comparison: None.

CLINICAL DATA: Crush injury and laceration

EXAM:
RIGHT FIFTH FINGER 2+V

[finger pa]
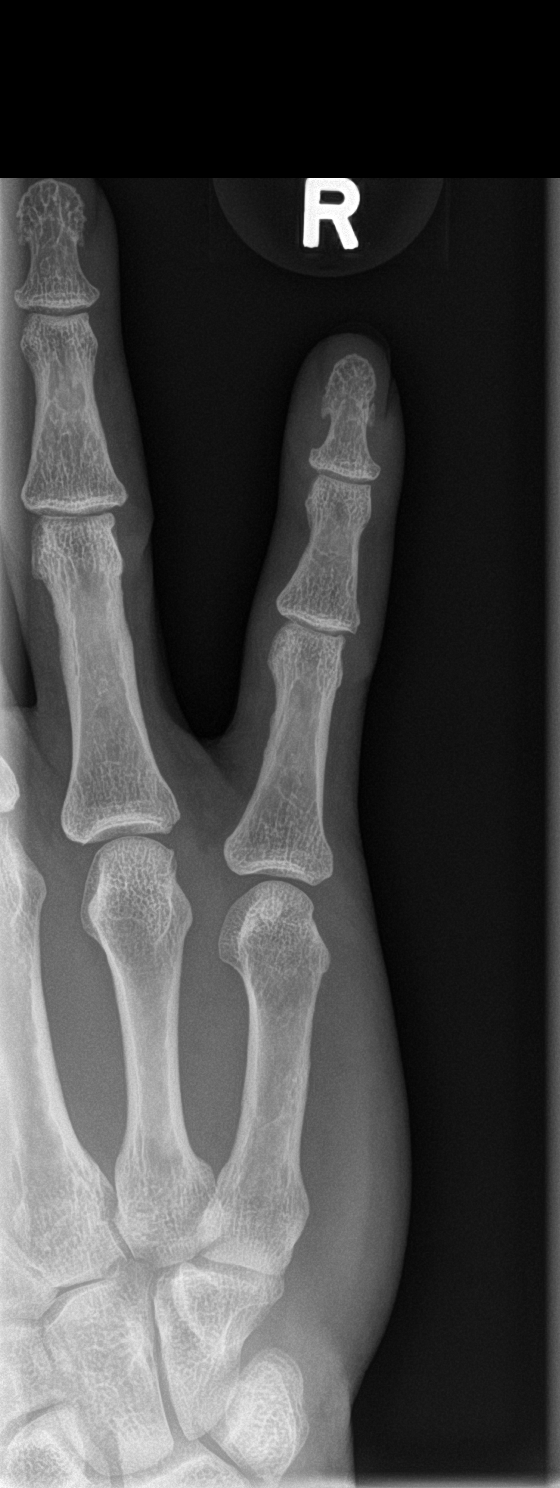

[finger obl]
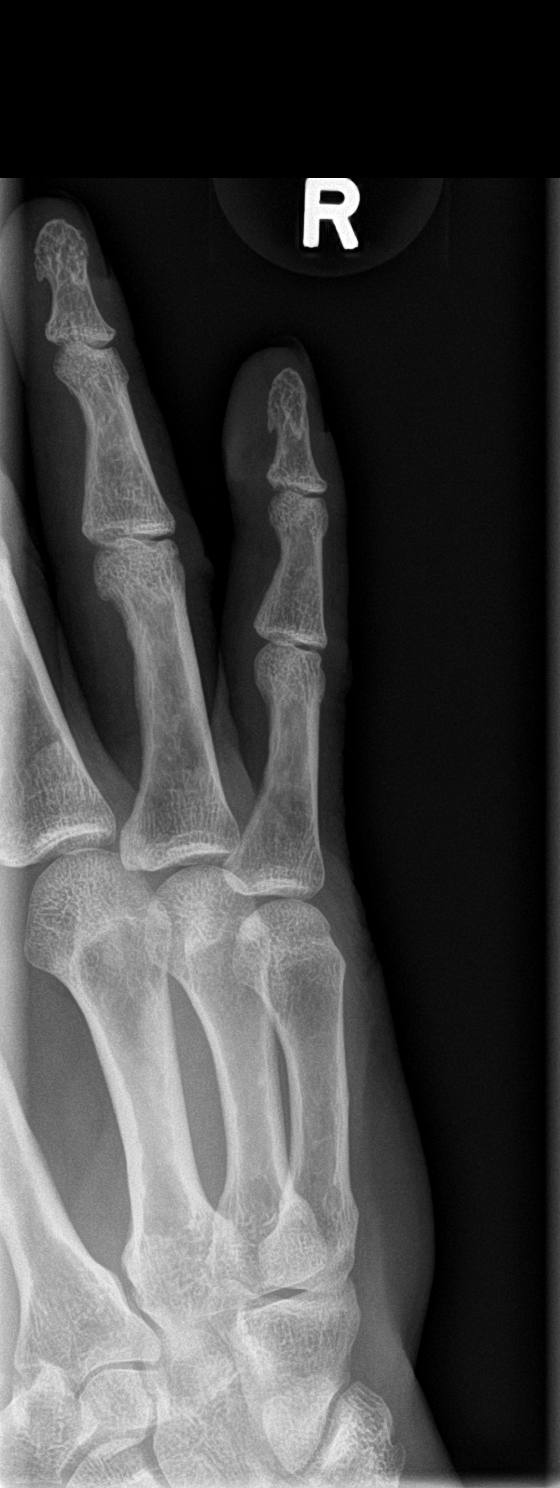

[finger lat]
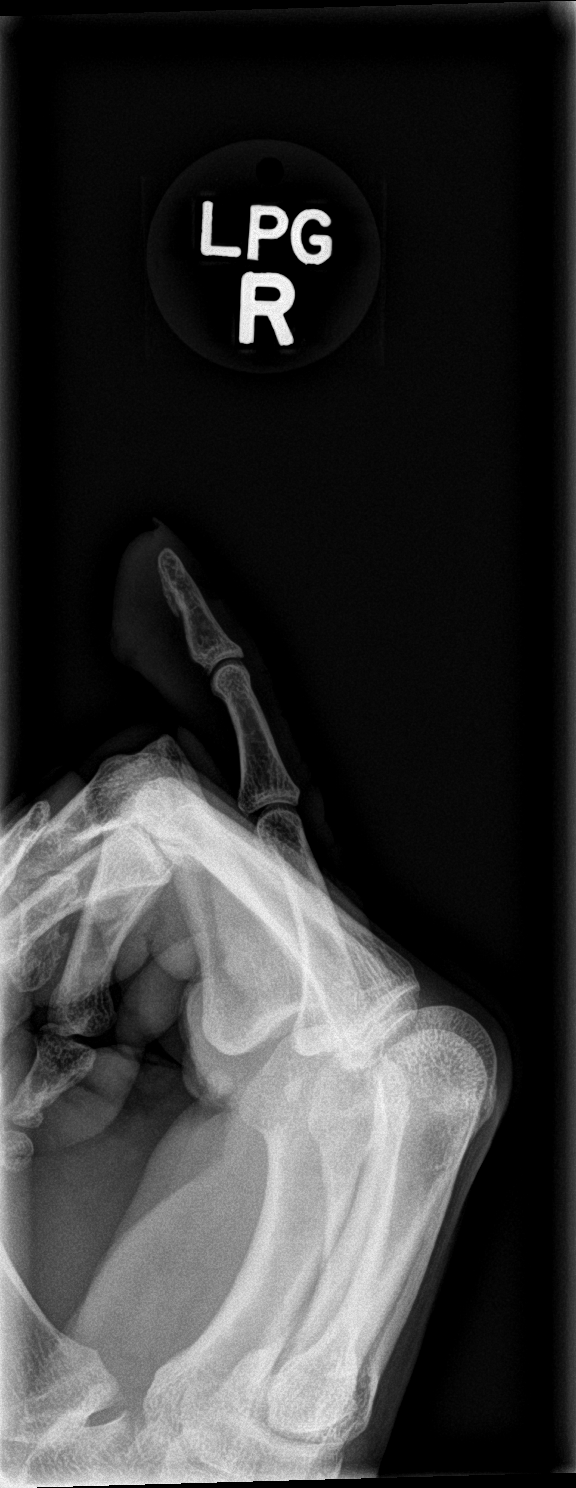

[3 of 3 positions shown; findings below may reference images not displayed]

FINDINGS: Frontal, oblique, and lateral views obtained. There is soft tissue
injury to the volar aspect of the distal fifth digit. No soft tissue
air or radiopaque foreign body. No fracture or dislocation. Joint
spaces appear normal. No erosive change.
IMPRESSION: Soft tissue injury to the volar aspect of the distal fifth digit. No
fracture or dislocation. No appreciable arthropathy.

## 2018-05-03 DIAGNOSIS — E7800 Pure hypercholesterolemia, unspecified: Secondary | ICD-10-CM | POA: Diagnosis not present

## 2018-05-03 DIAGNOSIS — E119 Type 2 diabetes mellitus without complications: Secondary | ICD-10-CM | POA: Diagnosis not present

## 2018-05-03 DIAGNOSIS — E663 Overweight: Secondary | ICD-10-CM | POA: Diagnosis not present

## 2018-05-03 DIAGNOSIS — E1140 Type 2 diabetes mellitus with diabetic neuropathy, unspecified: Secondary | ICD-10-CM | POA: Diagnosis not present

## 2018-05-03 DIAGNOSIS — I10 Essential (primary) hypertension: Secondary | ICD-10-CM | POA: Diagnosis not present

## 2018-05-03 DIAGNOSIS — F5101 Primary insomnia: Secondary | ICD-10-CM | POA: Diagnosis not present

## 2018-05-31 DIAGNOSIS — F5101 Primary insomnia: Secondary | ICD-10-CM | POA: Diagnosis not present

## 2018-05-31 DIAGNOSIS — M159 Polyosteoarthritis, unspecified: Secondary | ICD-10-CM | POA: Diagnosis not present

## 2018-05-31 DIAGNOSIS — I10 Essential (primary) hypertension: Secondary | ICD-10-CM | POA: Diagnosis not present

## 2018-05-31 DIAGNOSIS — E119 Type 2 diabetes mellitus without complications: Secondary | ICD-10-CM | POA: Diagnosis not present

## 2018-05-31 DIAGNOSIS — E663 Overweight: Secondary | ICD-10-CM | POA: Diagnosis not present

## 2018-06-28 DIAGNOSIS — M159 Polyosteoarthritis, unspecified: Secondary | ICD-10-CM | POA: Diagnosis not present

## 2018-06-28 DIAGNOSIS — E119 Type 2 diabetes mellitus without complications: Secondary | ICD-10-CM | POA: Diagnosis not present

## 2018-06-28 DIAGNOSIS — F5101 Primary insomnia: Secondary | ICD-10-CM | POA: Diagnosis not present

## 2018-06-28 DIAGNOSIS — I10 Essential (primary) hypertension: Secondary | ICD-10-CM | POA: Diagnosis not present

## 2018-07-26 DIAGNOSIS — E7800 Pure hypercholesterolemia, unspecified: Secondary | ICD-10-CM | POA: Diagnosis not present

## 2018-07-26 DIAGNOSIS — E1140 Type 2 diabetes mellitus with diabetic neuropathy, unspecified: Secondary | ICD-10-CM | POA: Diagnosis not present

## 2018-07-26 DIAGNOSIS — E291 Testicular hypofunction: Secondary | ICD-10-CM | POA: Diagnosis not present

## 2018-07-26 DIAGNOSIS — F5101 Primary insomnia: Secondary | ICD-10-CM | POA: Diagnosis not present

## 2018-07-26 DIAGNOSIS — Z23 Encounter for immunization: Secondary | ICD-10-CM | POA: Diagnosis not present

## 2018-07-26 DIAGNOSIS — E118 Type 2 diabetes mellitus with unspecified complications: Secondary | ICD-10-CM | POA: Diagnosis not present

## 2018-09-06 DIAGNOSIS — H6092 Unspecified otitis externa, left ear: Secondary | ICD-10-CM | POA: Diagnosis not present

## 2018-09-06 DIAGNOSIS — F5101 Primary insomnia: Secondary | ICD-10-CM | POA: Diagnosis not present

## 2018-09-06 DIAGNOSIS — E663 Overweight: Secondary | ICD-10-CM | POA: Diagnosis not present

## 2018-09-06 DIAGNOSIS — E118 Type 2 diabetes mellitus with unspecified complications: Secondary | ICD-10-CM | POA: Diagnosis not present

## 2018-09-06 DIAGNOSIS — E1140 Type 2 diabetes mellitus with diabetic neuropathy, unspecified: Secondary | ICD-10-CM | POA: Diagnosis not present

## 2018-09-13 DIAGNOSIS — H6692 Otitis media, unspecified, left ear: Secondary | ICD-10-CM | POA: Diagnosis not present

## 2018-09-13 DIAGNOSIS — F5101 Primary insomnia: Secondary | ICD-10-CM | POA: Diagnosis not present

## 2018-09-13 DIAGNOSIS — E1140 Type 2 diabetes mellitus with diabetic neuropathy, unspecified: Secondary | ICD-10-CM | POA: Diagnosis not present

## 2018-09-13 DIAGNOSIS — E118 Type 2 diabetes mellitus with unspecified complications: Secondary | ICD-10-CM | POA: Diagnosis not present

## 2018-09-27 DIAGNOSIS — E118 Type 2 diabetes mellitus with unspecified complications: Secondary | ICD-10-CM | POA: Diagnosis not present

## 2018-09-27 DIAGNOSIS — I10 Essential (primary) hypertension: Secondary | ICD-10-CM | POA: Diagnosis not present

## 2018-09-27 DIAGNOSIS — E291 Testicular hypofunction: Secondary | ICD-10-CM | POA: Diagnosis not present

## 2018-09-27 DIAGNOSIS — E1140 Type 2 diabetes mellitus with diabetic neuropathy, unspecified: Secondary | ICD-10-CM | POA: Diagnosis not present

## 2018-09-27 DIAGNOSIS — F5101 Primary insomnia: Secondary | ICD-10-CM | POA: Diagnosis not present

## 2018-12-13 DIAGNOSIS — Z1331 Encounter for screening for depression: Secondary | ICD-10-CM | POA: Diagnosis not present

## 2018-12-13 DIAGNOSIS — E1140 Type 2 diabetes mellitus with diabetic neuropathy, unspecified: Secondary | ICD-10-CM | POA: Diagnosis not present

## 2018-12-13 DIAGNOSIS — M25552 Pain in left hip: Secondary | ICD-10-CM | POA: Diagnosis not present

## 2018-12-13 DIAGNOSIS — I10 Essential (primary) hypertension: Secondary | ICD-10-CM | POA: Diagnosis not present

## 2018-12-13 DIAGNOSIS — E291 Testicular hypofunction: Secondary | ICD-10-CM | POA: Diagnosis not present

## 2018-12-13 DIAGNOSIS — F5101 Primary insomnia: Secondary | ICD-10-CM | POA: Diagnosis not present

## 2018-12-20 DIAGNOSIS — F5101 Primary insomnia: Secondary | ICD-10-CM | POA: Diagnosis not present

## 2018-12-20 DIAGNOSIS — M25552 Pain in left hip: Secondary | ICD-10-CM | POA: Diagnosis not present

## 2018-12-20 DIAGNOSIS — E663 Overweight: Secondary | ICD-10-CM | POA: Diagnosis not present

## 2018-12-20 DIAGNOSIS — E291 Testicular hypofunction: Secondary | ICD-10-CM | POA: Diagnosis not present

## 2018-12-20 DIAGNOSIS — E1140 Type 2 diabetes mellitus with diabetic neuropathy, unspecified: Secondary | ICD-10-CM | POA: Diagnosis not present

## 2019-01-03 DIAGNOSIS — E118 Type 2 diabetes mellitus with unspecified complications: Secondary | ICD-10-CM | POA: Diagnosis not present

## 2019-01-03 DIAGNOSIS — I10 Essential (primary) hypertension: Secondary | ICD-10-CM | POA: Diagnosis not present

## 2019-01-03 DIAGNOSIS — E1140 Type 2 diabetes mellitus with diabetic neuropathy, unspecified: Secondary | ICD-10-CM | POA: Diagnosis not present

## 2019-01-03 DIAGNOSIS — M25552 Pain in left hip: Secondary | ICD-10-CM | POA: Diagnosis not present

## 2019-01-03 DIAGNOSIS — F3341 Major depressive disorder, recurrent, in partial remission: Secondary | ICD-10-CM | POA: Diagnosis not present

## 2019-01-03 DIAGNOSIS — F5101 Primary insomnia: Secondary | ICD-10-CM | POA: Diagnosis not present

## 2019-02-27 DIAGNOSIS — F5101 Primary insomnia: Secondary | ICD-10-CM | POA: Diagnosis not present

## 2019-02-27 DIAGNOSIS — E1140 Type 2 diabetes mellitus with diabetic neuropathy, unspecified: Secondary | ICD-10-CM | POA: Diagnosis not present

## 2019-02-27 DIAGNOSIS — E291 Testicular hypofunction: Secondary | ICD-10-CM | POA: Diagnosis not present

## 2019-02-27 DIAGNOSIS — M25552 Pain in left hip: Secondary | ICD-10-CM | POA: Diagnosis not present

## 2019-03-14 DIAGNOSIS — E7800 Pure hypercholesterolemia, unspecified: Secondary | ICD-10-CM | POA: Diagnosis not present

## 2019-03-14 DIAGNOSIS — I10 Essential (primary) hypertension: Secondary | ICD-10-CM | POA: Diagnosis not present

## 2019-03-14 DIAGNOSIS — F5101 Primary insomnia: Secondary | ICD-10-CM | POA: Diagnosis not present

## 2019-03-14 DIAGNOSIS — E1140 Type 2 diabetes mellitus with diabetic neuropathy, unspecified: Secondary | ICD-10-CM | POA: Diagnosis not present

## 2019-03-14 DIAGNOSIS — E291 Testicular hypofunction: Secondary | ICD-10-CM | POA: Diagnosis not present

## 2019-03-14 DIAGNOSIS — E118 Type 2 diabetes mellitus with unspecified complications: Secondary | ICD-10-CM | POA: Diagnosis not present

## 2019-03-14 DIAGNOSIS — M25552 Pain in left hip: Secondary | ICD-10-CM | POA: Diagnosis not present

## 2019-06-20 DIAGNOSIS — E1140 Type 2 diabetes mellitus with diabetic neuropathy, unspecified: Secondary | ICD-10-CM | POA: Diagnosis not present

## 2019-06-20 DIAGNOSIS — I10 Essential (primary) hypertension: Secondary | ICD-10-CM | POA: Diagnosis not present

## 2019-06-20 DIAGNOSIS — E291 Testicular hypofunction: Secondary | ICD-10-CM | POA: Diagnosis not present

## 2019-06-20 DIAGNOSIS — E118 Type 2 diabetes mellitus with unspecified complications: Secondary | ICD-10-CM | POA: Diagnosis not present

## 2019-06-20 DIAGNOSIS — E7800 Pure hypercholesterolemia, unspecified: Secondary | ICD-10-CM | POA: Diagnosis not present

## 2019-06-20 DIAGNOSIS — M25552 Pain in left hip: Secondary | ICD-10-CM | POA: Diagnosis not present

## 2019-06-20 DIAGNOSIS — Z23 Encounter for immunization: Secondary | ICD-10-CM | POA: Diagnosis not present

## 2019-06-20 DIAGNOSIS — M5416 Radiculopathy, lumbar region: Secondary | ICD-10-CM | POA: Diagnosis not present

## 2019-07-29 DIAGNOSIS — M5416 Radiculopathy, lumbar region: Secondary | ICD-10-CM | POA: Diagnosis not present

## 2019-11-06 DIAGNOSIS — Z23 Encounter for immunization: Secondary | ICD-10-CM | POA: Diagnosis not present

## 2019-12-04 DIAGNOSIS — Z23 Encounter for immunization: Secondary | ICD-10-CM | POA: Diagnosis not present

## 2020-03-02 DIAGNOSIS — E291 Testicular hypofunction: Secondary | ICD-10-CM | POA: Diagnosis not present

## 2020-03-02 DIAGNOSIS — E7800 Pure hypercholesterolemia, unspecified: Secondary | ICD-10-CM | POA: Diagnosis not present

## 2020-03-02 DIAGNOSIS — I10 Essential (primary) hypertension: Secondary | ICD-10-CM | POA: Diagnosis not present

## 2020-03-02 DIAGNOSIS — F5101 Primary insomnia: Secondary | ICD-10-CM | POA: Diagnosis not present

## 2020-03-02 DIAGNOSIS — E118 Type 2 diabetes mellitus with unspecified complications: Secondary | ICD-10-CM | POA: Diagnosis not present

## 2020-03-02 DIAGNOSIS — M25552 Pain in left hip: Secondary | ICD-10-CM | POA: Diagnosis not present

## 2020-03-02 DIAGNOSIS — E1140 Type 2 diabetes mellitus with diabetic neuropathy, unspecified: Secondary | ICD-10-CM | POA: Diagnosis not present

## 2020-08-29 DIAGNOSIS — F5101 Primary insomnia: Secondary | ICD-10-CM | POA: Diagnosis not present

## 2020-08-29 DIAGNOSIS — I10 Essential (primary) hypertension: Secondary | ICD-10-CM | POA: Diagnosis not present

## 2020-08-29 DIAGNOSIS — E1140 Type 2 diabetes mellitus with diabetic neuropathy, unspecified: Secondary | ICD-10-CM | POA: Diagnosis not present

## 2020-08-29 DIAGNOSIS — E291 Testicular hypofunction: Secondary | ICD-10-CM | POA: Diagnosis not present

## 2020-08-29 DIAGNOSIS — E118 Type 2 diabetes mellitus with unspecified complications: Secondary | ICD-10-CM | POA: Diagnosis not present

## 2020-08-29 DIAGNOSIS — E7800 Pure hypercholesterolemia, unspecified: Secondary | ICD-10-CM | POA: Diagnosis not present

## 2020-09-12 DIAGNOSIS — E1140 Type 2 diabetes mellitus with diabetic neuropathy, unspecified: Secondary | ICD-10-CM | POA: Diagnosis not present

## 2020-09-12 DIAGNOSIS — E291 Testicular hypofunction: Secondary | ICD-10-CM | POA: Diagnosis not present

## 2020-09-12 DIAGNOSIS — E118 Type 2 diabetes mellitus with unspecified complications: Secondary | ICD-10-CM | POA: Diagnosis not present

## 2020-09-12 DIAGNOSIS — F5101 Primary insomnia: Secondary | ICD-10-CM | POA: Diagnosis not present

## 2021-01-07 DIAGNOSIS — F5101 Primary insomnia: Secondary | ICD-10-CM | POA: Diagnosis not present

## 2021-01-07 DIAGNOSIS — E118 Type 2 diabetes mellitus with unspecified complications: Secondary | ICD-10-CM | POA: Diagnosis not present

## 2021-01-07 DIAGNOSIS — E1140 Type 2 diabetes mellitus with diabetic neuropathy, unspecified: Secondary | ICD-10-CM | POA: Diagnosis not present

## 2021-01-07 DIAGNOSIS — E291 Testicular hypofunction: Secondary | ICD-10-CM | POA: Diagnosis not present

## 2021-02-04 DIAGNOSIS — E1140 Type 2 diabetes mellitus with diabetic neuropathy, unspecified: Secondary | ICD-10-CM | POA: Diagnosis not present

## 2021-02-04 DIAGNOSIS — F5101 Primary insomnia: Secondary | ICD-10-CM | POA: Diagnosis not present

## 2021-02-04 DIAGNOSIS — E291 Testicular hypofunction: Secondary | ICD-10-CM | POA: Diagnosis not present

## 2021-02-04 DIAGNOSIS — I10 Essential (primary) hypertension: Secondary | ICD-10-CM | POA: Diagnosis not present

## 2021-02-04 DIAGNOSIS — E118 Type 2 diabetes mellitus with unspecified complications: Secondary | ICD-10-CM | POA: Diagnosis not present

## 2021-02-04 DIAGNOSIS — E7800 Pure hypercholesterolemia, unspecified: Secondary | ICD-10-CM | POA: Diagnosis not present

## 2021-06-10 DIAGNOSIS — E291 Testicular hypofunction: Secondary | ICD-10-CM | POA: Diagnosis not present

## 2021-06-10 DIAGNOSIS — E1140 Type 2 diabetes mellitus with diabetic neuropathy, unspecified: Secondary | ICD-10-CM | POA: Diagnosis not present

## 2021-06-10 DIAGNOSIS — I10 Essential (primary) hypertension: Secondary | ICD-10-CM | POA: Diagnosis not present

## 2021-06-10 DIAGNOSIS — R5382 Chronic fatigue, unspecified: Secondary | ICD-10-CM | POA: Diagnosis not present

## 2021-06-10 DIAGNOSIS — E7800 Pure hypercholesterolemia, unspecified: Secondary | ICD-10-CM | POA: Diagnosis not present

## 2021-06-10 DIAGNOSIS — E118 Type 2 diabetes mellitus with unspecified complications: Secondary | ICD-10-CM | POA: Diagnosis not present

## 2023-01-07 ENCOUNTER — Ambulatory Visit: Payer: BC Managed Care – PPO | Admitting: Podiatry

## 2023-01-07 ENCOUNTER — Ambulatory Visit (INDEPENDENT_AMBULATORY_CARE_PROVIDER_SITE_OTHER): Payer: BC Managed Care – PPO

## 2023-01-07 DIAGNOSIS — R2241 Localized swelling, mass and lump, right lower limb: Secondary | ICD-10-CM

## 2023-01-07 DIAGNOSIS — M6740 Ganglion, unspecified site: Secondary | ICD-10-CM | POA: Diagnosis not present

## 2023-01-07 NOTE — Progress Notes (Signed)
  Subjective:  Patient ID: Joseph Anderson, male    DOB: 08-17-1965,  MRN: [id number]  Chief Complaint  Patient presents with   Foot Problem    Knot to right foot lateral aspect of the foot x 2 months. Denies any pain at this time. Size of the knot fluctuates. Patient is diabetic.     57 y.o. male presents with concern for mass on the right foot lateral dorsal aspect x 2 months.  He has noticed this mass for a while.  He says it does not cause pain.  He does have a history of diabetes.  Wanted to get it checked out and see if anything can be done to remove it burns  Past Medical History:  Diagnosis Date   Alcohol abuse    Diabetes mellitus without complication (HCC)    Tobacco abuse     No Known Allergies  ROS: Negative except as per HPI above  Objective:  General: AAO x3, NAD  Dermatological: 1.5 cm diameter circular raised lesion the dorsal lateral aspect of the right foot.  Firm and fluctuant consistent with ganglion cyst.  Upon aspiration the fluid has a yellowish tinge to it and is gelatinous like ganglion cyst fluid  Vascular:  Dorsalis Pedis artery and Posterior Tibial artery pedal pulses are 2/4 bilateral.  Capillary fill time < 3 sec to all digits.   Neruologic: Grossly intact via light touch bilateral. Protective threshold intact to all sites bilateral.   Musculoskeletal: No gross boney pedal deformities bilateral. No pain, crepitus, or limitation noted with foot and ankle range of motion bilateral. Muscular strength 5/5 in all groups tested bilateral.  Gait: Unassisted, Nonantalgic.   No images are attached to the encounter.  Radiographs:  Date: 01/07/2023 XR the right foot Weightbearing AP/Lateral/Oblique   Findings: no fracture, dislocation, swelling or degenerative changes noted Assessment:   1. Ganglion cyst   2. Nodule of skin of right foot      Plan:  Patient was evaluated and treated and all questions answered.  # Ganglion cyst of right  foot -Discussed with patient he does appear to have a ganglion cyst of his right foot. -Fortunately not causing any pain however patient does want to removed -I recommend aspiration of the cyst.  Patient was agreeable -After prep with alcohol prep pad and Betadine swab I then used an 18-gauge needle and syringe to aspirate the cyst.  I was able to aspirate approximately 1 cc of gelatinous cyst fluid.  Patient tolerated well adhesive bandage was applied. -If he does begin to develop any pain with this area we would have to consider surgical excision  Return if symptoms worsen or fail to improve.          Corinna Gab, DPM [facility]
